# Patient Record
Sex: Female | Born: 1990 | Race: Black or African American | Hispanic: No | Marital: Single | Smoking: Never smoker
Health system: Southern US, Community
[De-identification: ages and names within clinical notes are randomized; demographics above are authoritative.]

## PROBLEM LIST (undated history)

## (undated) DIAGNOSIS — E221 Hyperprolactinemia: Secondary | ICD-10-CM

## (undated) DIAGNOSIS — E282 Polycystic ovarian syndrome: Secondary | ICD-10-CM

## (undated) DIAGNOSIS — G43909 Migraine, unspecified, not intractable, without status migrainosus: Secondary | ICD-10-CM

## (undated) HISTORY — PX: TONSILLECTOMY: SUR1361

---
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
## 2020-03-09 ENCOUNTER — Other Ambulatory Visit: Payer: Self-pay

## 2020-03-09 ENCOUNTER — Emergency Department (HOSPITAL_COMMUNITY): Payer: BC Managed Care – PPO

## 2020-03-09 ENCOUNTER — Emergency Department (HOSPITAL_COMMUNITY)
Admission: EM | Admit: 2020-03-09 | Discharge: 2020-03-09 | Disposition: A | Discharge status: Home Health | Payer: BC Managed Care – PPO | Attending: Emergency Medicine | Admitting: Emergency Medicine

## 2020-03-09 ENCOUNTER — Encounter (HOSPITAL_COMMUNITY): Payer: Self-pay | Admitting: Obstetrics and Gynecology

## 2020-03-09 DIAGNOSIS — R519 Headache, unspecified: Secondary | ICD-10-CM

## 2020-03-09 DIAGNOSIS — E282 Polycystic ovarian syndrome: Secondary | ICD-10-CM | POA: Insufficient documentation

## 2020-03-09 DIAGNOSIS — E221 Hyperprolactinemia: Secondary | ICD-10-CM | POA: Diagnosis not present

## 2020-03-09 DIAGNOSIS — G44219 Episodic tension-type headache, not intractable: Secondary | ICD-10-CM | POA: Diagnosis not present

## 2020-03-09 HISTORY — DX: Hyperprolactinemia: E22.1

## 2020-03-09 HISTORY — DX: Polycystic ovarian syndrome: E28.2

## 2020-03-09 HISTORY — DX: Migraine, unspecified, not intractable, without status migrainosus: G43.909

## 2020-03-09 LAB — CBC WITH DIFFERENTIAL/PLATELET
Abs Immature Granulocytes: 0.04 10*3/uL (ref 0.00–0.07)
Basophils Absolute: 0.1 10*3/uL (ref 0.0–0.1)
Basophils Relative: 0 %
Eosinophils Absolute: 0.2 10*3/uL (ref 0.0–0.5)
Eosinophils Relative: 2 %
HCT: 40.1 % (ref 36.0–46.0)
Hemoglobin: 13.2 g/dL (ref 12.0–15.0)
Immature Granulocytes: 0 %
Lymphocytes Relative: 26 %
Lymphs Abs: 3 10*3/uL (ref 0.7–4.0)
MCH: 28.6 pg (ref 26.0–34.0)
MCHC: 32.9 g/dL (ref 30.0–36.0)
MCV: 87 fL (ref 80.0–100.0)
Monocytes Absolute: 0.6 10*3/uL (ref 0.1–1.0)
Monocytes Relative: 6 %
Neutro Abs: 7.5 10*3/uL (ref 1.7–7.7)
Neutrophils Relative %: 66 %
Platelets: 331 10*3/uL (ref 150–400)
RBC: 4.61 MIL/uL (ref 3.87–5.11)
RDW: 12.6 % (ref 11.5–15.5)
WBC: 11.4 10*3/uL — ABNORMAL HIGH (ref 4.0–10.5)
nRBC: 0 % (ref 0.0–0.2)

## 2020-03-09 LAB — COMPREHENSIVE METABOLIC PANEL
ALT: 16 U/L (ref 0–44)
AST: 22 U/L (ref 15–41)
Albumin: 3.8 g/dL (ref 3.5–5.0)
Alkaline Phosphatase: 80 U/L (ref 38–126)
Anion gap: 10 (ref 5–15)
BUN: 9 mg/dL (ref 6–20)
CO2: 26 mmol/L (ref 22–32)
Calcium: 9 mg/dL (ref 8.9–10.3)
Chloride: 102 mmol/L (ref 98–111)
Creatinine, Ser: 0.6 mg/dL (ref 0.44–1.00)
GFR calc Af Amer: >60 mL/min (ref >60)
GFR calc non Af Amer: >60 mL/min (ref >60)
Glucose, Bld: 102 mg/dL — ABNORMAL HIGH (ref 70–99)
Potassium: 4.1 mmol/L (ref 3.5–5.1)
Sodium: 138 mmol/L (ref 135–145)
Total Bilirubin: 0.6 mg/dL (ref 0.3–1.2)
Total Protein: 7.6 g/dL (ref 6.5–8.1)

## 2020-03-09 MED ORDER — GADOBUTROL 1 MMOL/ML IV SOLN
10.0000 mL | Freq: Once | INTRAVENOUS | Status: AC | PRN
  Administered 2020-03-09: 10 mL via INTRAVENOUS

## 2020-03-09 NOTE — ED Provider Notes (Signed)
Patient signed to me by Dr. Effie Shy any MRI brain which came back normal.  Patient symptoms likely from migraine.  Will discharge   Lorre Nick, MD 03/09/20 [phone]

## 2020-03-09 NOTE — ED Triage Notes (Signed)
Patient reports facial numbness and headaches since last night. Patient is tearful and reports she is scared. Patient has no neuro deficits at this time but reports the sensation to the right cheek is different than the left. Smile is symmetrical.

## 2020-03-09 NOTE — ED Provider Notes (Signed)
[facility]PT Provider Note   CSN: [id number] Arrival date & time: 03/09/20  1120     History Chief Complaint  Patient presents with  . Headache    Kelly Marks is a 29 y.o. female.  HPI She presents for intermittent, but worsening headache over the last month.  In the last 5 days the headache has become constant.  Previous she has had migraines, treated symptomatically.  She has recurrent elevated prolactin, and history of PCOS.  She has not had in-depth evaluation for hyperprolactinemia.  She has had some nausea but no vomiting.  She denies fever, chills, cough, shortness of breath, focal weakness or dizziness.  She has some numbness of her right face.  She is using over-the-counter medications without relief.  There are no other known modifying factors.    Past Medical History:  Diagnosis Date  . Hyperprolactinemia (HCC)   . Migraine   . PCOS (polycystic ovarian syndrome)     There are no problems to display for this patient.      OB History   No obstetric history on file.     No family history on file.  Social History   Tobacco Use  . Smoking status: Never Smoker  . Smokeless tobacco: Never Used  Substance Use Topics  . Alcohol use: Not Currently  . Drug use: Not Currently    Home Medications Prior to Admission medications   Medication Sig Start Date End Date Taking? Authorizing Provider  AUROVELA 24 FE 1-20 MG-MCG(24) tablet Take 1 tablet by mouth daily. 03/02/20  Yes [provider]  ibuprofen (ADVIL) 200 MG tablet Take 400 mg by mouth every 6 (six) hours as needed for headache or mild pain.   Yes [provider]    Allergies    Sulfa antibiotics  Review of Systems   Review of Systems  All other systems reviewed and are negative.   Physical Exam Updated Vital Signs BP 119/85   Pulse 76   Temp 98.7 F (37.1 C) (Oral)   Resp 18   LMP 02/10/2020   SpO2 98%   Physical Exam Vitals and  nursing note reviewed.  Constitutional:      General: She is not in acute distress.    Appearance: She is well-developed. She is not ill-appearing or diaphoretic.  HENT:     Head: Normocephalic and atraumatic.     Right Ear: External ear normal.     Left Ear: External ear normal.     Nose: No congestion or rhinorrhea.     Mouth/Throat:     Pharynx: No oropharyngeal exudate or posterior oropharyngeal erythema.  Eyes:     Conjunctiva/sclera: Conjunctivae normal.     Pupils: Pupils are equal, round, and reactive to light.  Neck:     Trachea: Phonation normal.  Cardiovascular:     Rate and Rhythm: Normal rate and regular rhythm.     Heart sounds: Normal heart sounds.  Pulmonary:     Effort: Pulmonary effort is normal.     Breath sounds: Normal breath sounds.  Abdominal:     General: There is no distension.     Palpations: Abdomen is soft.     Tenderness: There is no abdominal tenderness.  Musculoskeletal:        General: Normal range of motion.     Cervical back: Normal range of motion and neck supple.  Skin:    General: Skin is warm and dry.  Neurological:  Mental Status: She is alert and oriented to person, place, and time.     Cranial Nerves: No cranial nerve deficit.     Sensory: No sensory deficit.     Motor: No abnormal muscle tone.     Coordination: Coordination normal.     Comments: No dysarthria, aphasia, nystagmus or ataxia.  Psychiatric:        Mood and Affect: Mood normal.        Behavior: Behavior normal.        Thought Content: Thought content normal.        Judgment: Judgment normal.     ED Results / Procedures / Treatments   Labs (all labs ordered are listed, but only abnormal results are displayed) Labs Reviewed  COMPREHENSIVE METABOLIC PANEL - Abnormal; Notable for the following components:      Result Value   Glucose, Bld 102 (*)    All other components within normal limits  CBC WITH DIFFERENTIAL/PLATELET - Abnormal; Notable for the following  components:   WBC 11.4 (*)    All other components within normal limits    EKG None  Radiology No results found.  Procedures Procedures (including critical care time)  Medications Ordered in ED Medications - No data to display  ED Course  I have reviewed the triage vital signs and the nursing notes.  Pertinent labs & imaging results that were available during my care of the patient were reviewed by me and considered in my medical decision making (see chart for details).    MDM Rules/Calculators/A&P                           Patient Vitals for the past 24 hrs:  BP Temp Temp src Pulse Resp SpO2  03/09/20 1349 119/85 -- -- 76 18 98 %  03/09/20 1345 119/85 -- -- 86 -- 99 %  03/09/20 1136 (!) 184/111 98.7 F (37.1 C) Oral 97 15 100 %      Medical Decision Making:  This patient is presenting for evaluation of headaches, worsening despite symptomatic treatment, which does require a range of treatment options, and is a complaint that involves a moderate risk of morbidity and mortality. The differential diagnoses include migraine headache, pituitary adenoma, nonspecific headache. I decided to review old records, and in summary patient with ongoing headaches, history of migraines and known hyperprolactinemia.  I did not require additional historical information from anyone.  Clinical Laboratory Tests Ordered, included CBC and Metabolic panel. Review indicates normal set mild elevation of white blood cell count.   Critical Interventions-clinical evaluation, laboratory testing, MRI imaging, observation reassessment    CRITICAL CARE-no Performed by: Mancel Bale  Nursing Notes Reviewed/ Care Coordinated Applicable Imaging Reviewed Interpretation of Laboratory Data incorporated into ED treatment   Plan disposition after completion of MRI imaging to evaluate for pituitary adenoma.  Anticipate discharge with symptomatic treatment for nonspecific headache.   Final Clinical  Impression(s) / ED Diagnoses Final diagnoses:  Nonintractable episodic headache, unspecified headache type    Rx / DC Orders ED Discharge Orders    None       Mancel Bale, MD 03/09/20 1701

## 2021-11-23 IMAGING — MR MR HEAD WO/W CM
9 series · 33 of 48 positions shown · IV contrast (gadavist)
Comparison: None.

CLINICAL DATA: Headache. Elevated prolactin. Right-sided facial
numbness. Assess for pituitary adenoma.

EXAM:
MRI HEAD WITHOUT AND WITH CONTRAST
TECHNIQUE: Multiplanar, multiecho pulse sequences of the brain and surrounding
structures were obtained without and with intravenous contrast.
CONTRAST:  10mL GADAVIST GADOBUTROL 1 MMOL/ML IV SOLN

[Series 9: T1 · sagittal · 5.0mm · 0.75mm/px · 2 of 24 slices shown (1 of 4)]
[im 1/24]
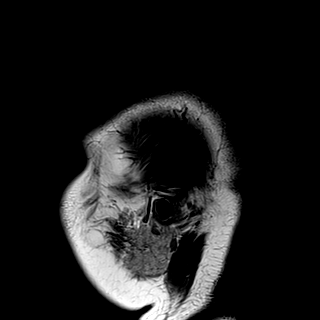
[im 24/24]
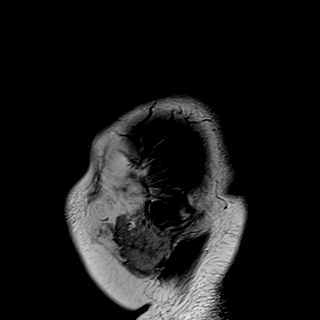

[Series 10: T2 · axial · 5.0mm · 0.60mm/px · z∈[-17,+138]mm · 4 of 25 slices shown]
[im 1/25]
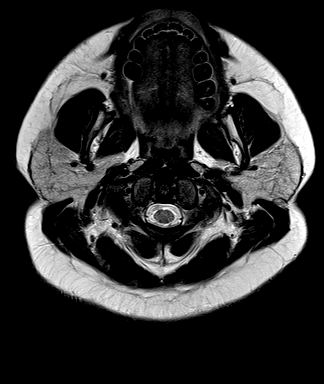
[im 9/25]
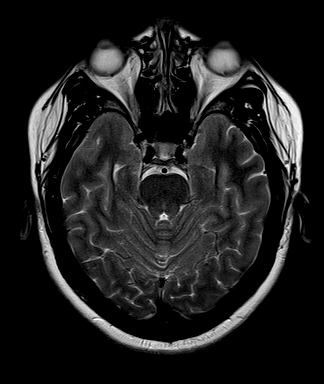
[im 17/25]
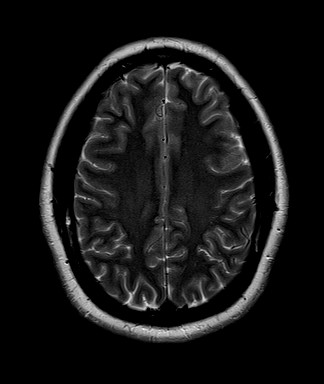
[im 25/25]
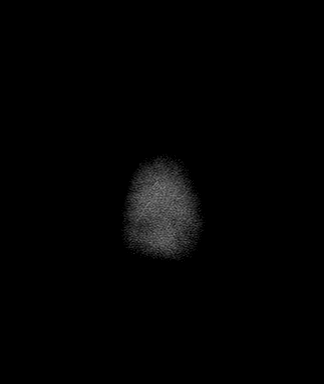

[Series 11: T1 · sagittal · 3.0mm · 0.35mm/px · 2 of 11 slices shown (2 of 4)]
[im 1/11]
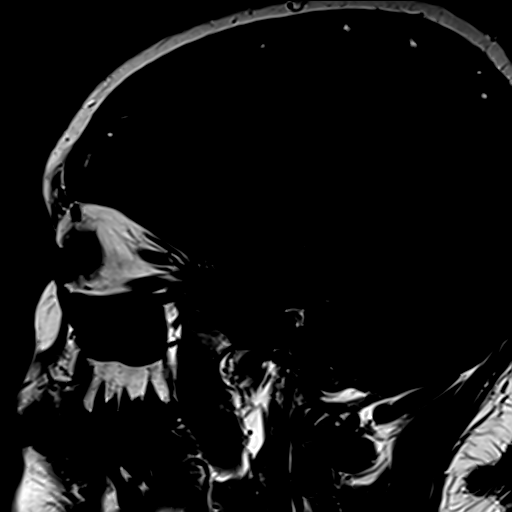
[im 11/11]
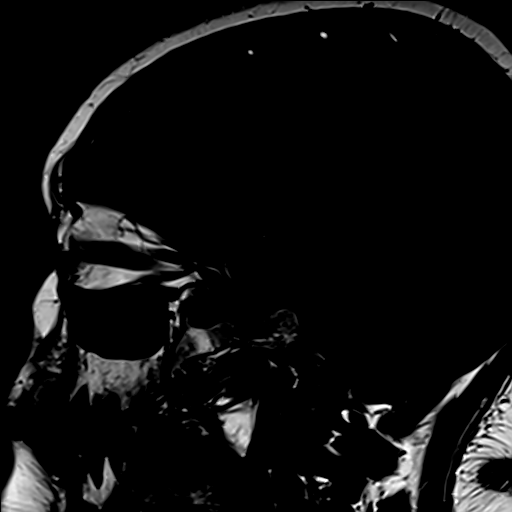

[Series 12: T1 · coronal · non-contrast · 3.0mm · 0.31mm/px · 2 of 13 slices shown (3 of 4)]
[im 1/13]
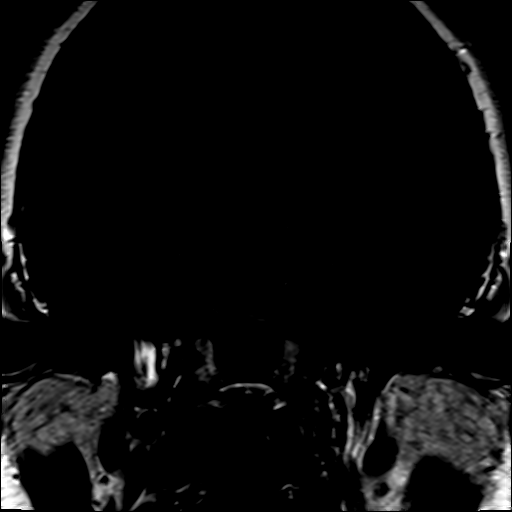
[im 13/13]
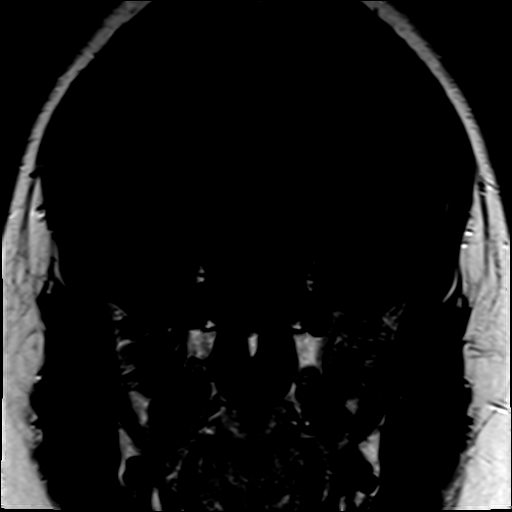

[Series 13: T1 · coronal · 3.0mm · 0.42mm/px · 4 of 49 slices shown (4 of 4)]
[im 1/49]
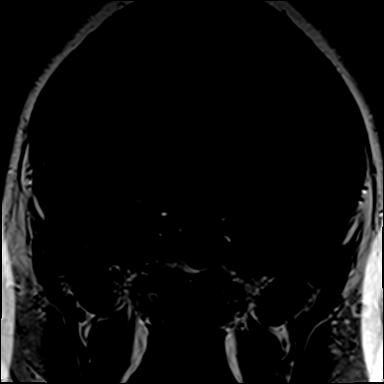
[im 9/49]
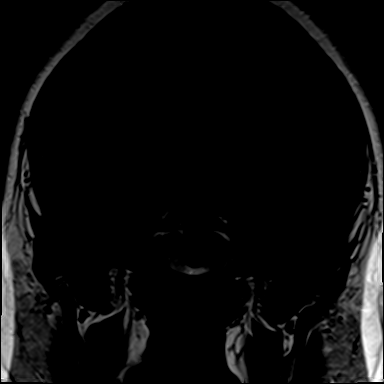
[im 17/49]
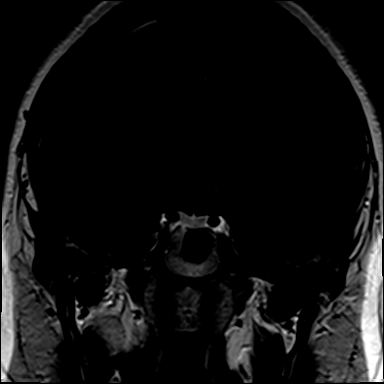
[im 25/49]
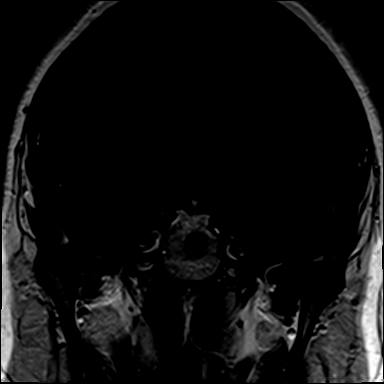

[Series 14: T1 post-contrast · coronal · 3.0mm · 0.31mm/px · 2 of 13 slices shown (1 of 4)]
[im 1/13]
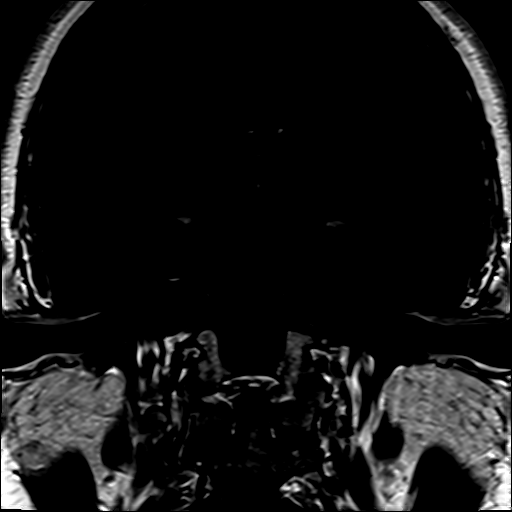
[im 13/13]
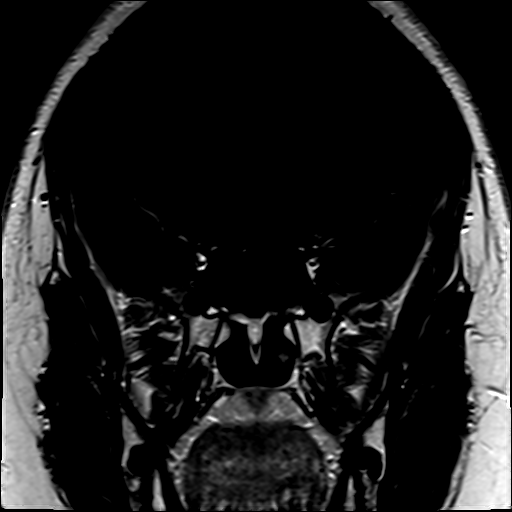

[Series 15: T1 post-contrast · sagittal · 3.0mm · 0.35mm/px · 2 of 11 slices shown (2 of 4)]
[im 1/11]
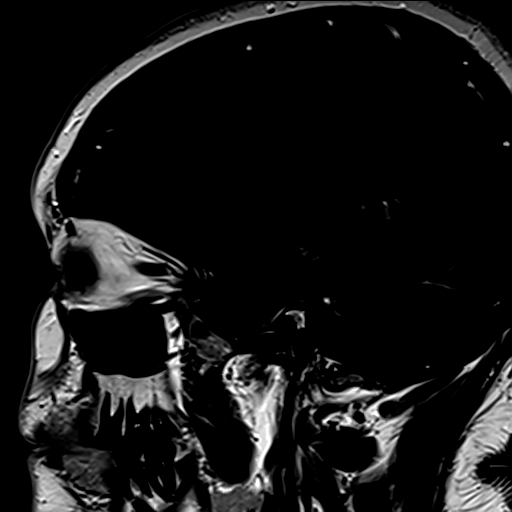
[im 11/11]
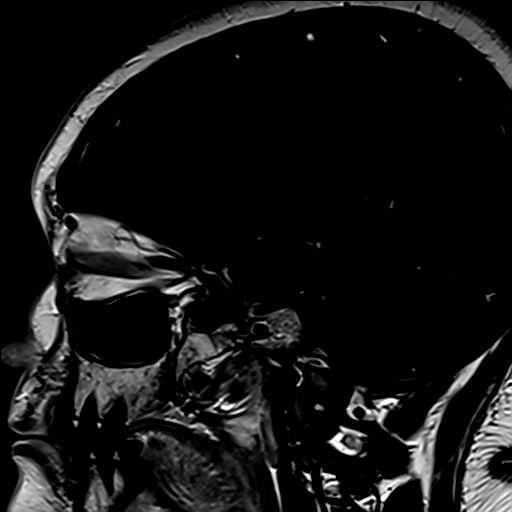

[Series 16: T1 post-contrast · axial · 1.0mm · 0.94mm/px · z∈[-16,+127]mm · 11 of 160 slices shown (3 of 4)]
[im 8/160]
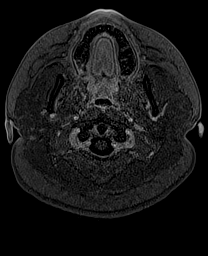
[im 22/160]
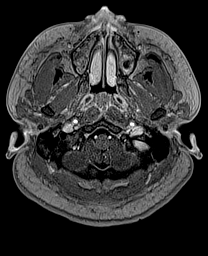
[im 29/160]
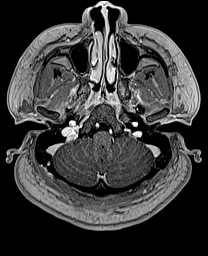
[im 51/160]
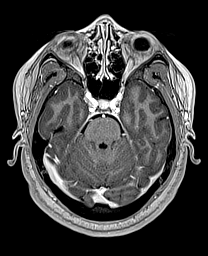
[im 73/160]
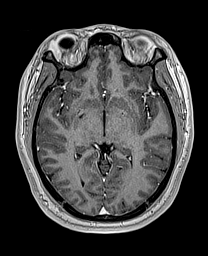
[im 80/160]
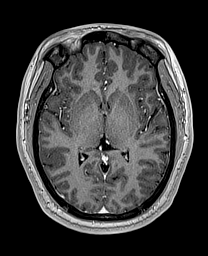
[im 87/160]
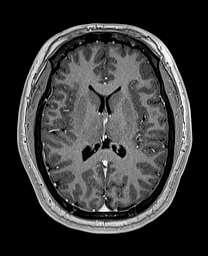
[im 109/160]
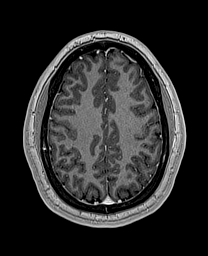
[im 131/160]
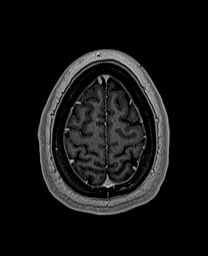
[im 138/160]
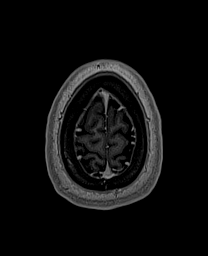
[im 152/160]
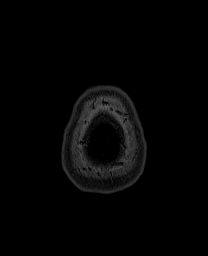

[Series 17: T1 post-contrast · coronal · 5.0mm · 0.43mm/px · 4 of 26 slices shown (4 of 4)]
[im 1/26]
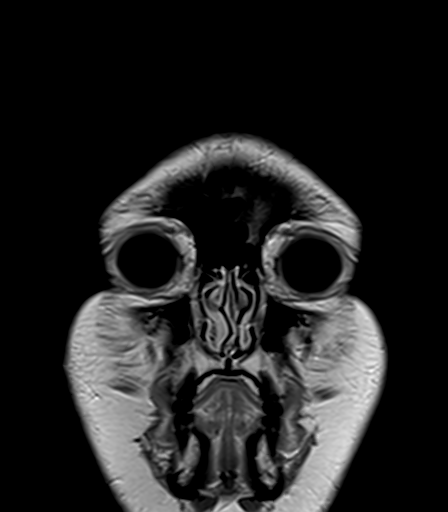
[im 9/26]
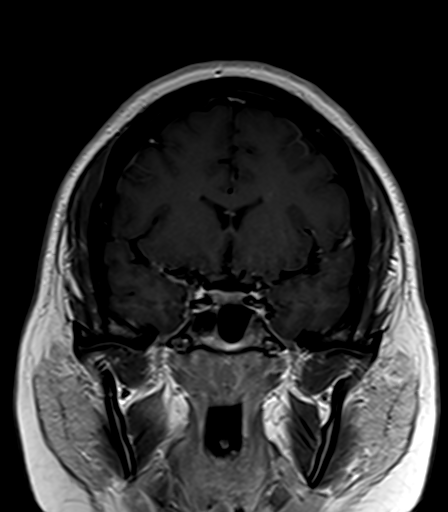
[im 17/26]
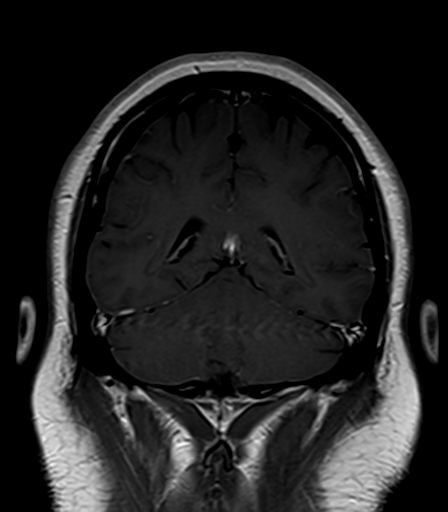
[im 26/26]
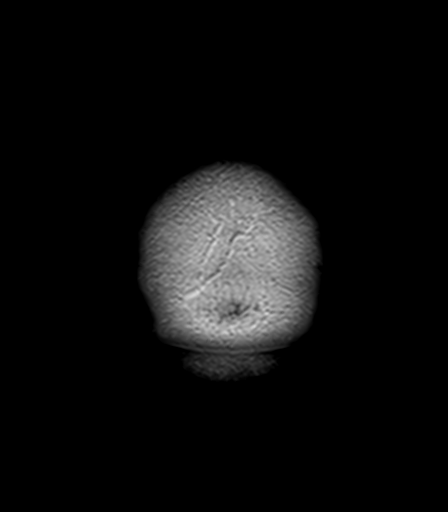

[33 of 48 positions shown; findings below may reference images not displayed]

FINDINGS: Brain: The brain has a normal appearance without evidence of
malformation, atrophy, old or acute small or large vessel
infarction, mass lesion, hemorrhage, hydrocephalus or extra-axial
collection.

Pituitary gland is normal in size. Normal shape with concave upper
surface. Homogeneous enhancement pattern. Infundibulum is midline.

No abnormal enhancement occurs elsewhere.

Vascular: Major vessels at the base of the brain show flow. Venous
sinuses appear patent.

Skull and upper cervical spine: Normal.

Sinuses/Orbits: Clear/normal.

Other: None significant.
IMPRESSION: Normal MRI of the brain and pituitary gland. No pituitary macro
adenoma. No visible micro adenoma. No other abnormality seen to
explain headache.
